# Patient Record
Sex: Female | Born: 1960 | Race: Black or African American | Hispanic: No | Marital: Single | Smoking: Current every day smoker
Health system: Southern US, Community
[De-identification: ages and names within clinical notes are randomized; demographics above are authoritative.]

## PROBLEM LIST (undated history)

## (undated) DIAGNOSIS — G5600 Carpal tunnel syndrome, unspecified upper limb: Secondary | ICD-10-CM

## (undated) DIAGNOSIS — M419 Scoliosis, unspecified: Secondary | ICD-10-CM

## (undated) DIAGNOSIS — G4730 Sleep apnea, unspecified: Secondary | ICD-10-CM

## (undated) DIAGNOSIS — M1990 Unspecified osteoarthritis, unspecified site: Secondary | ICD-10-CM

---

## 2010-12-07 ENCOUNTER — Emergency Department: Payer: Self-pay | Admitting: Emergency Medicine

## 2010-12-28 ENCOUNTER — Encounter: Payer: Self-pay | Admitting: Family Medicine

## 2011-01-10 ENCOUNTER — Encounter: Payer: Self-pay | Admitting: Family Medicine

## 2011-02-08 ENCOUNTER — Ambulatory Visit: Payer: Self-pay | Admitting: Family Medicine

## 2011-04-28 ENCOUNTER — Emergency Department: Payer: Self-pay | Admitting: Unknown Physician Specialty

## 2011-04-28 LAB — COMPREHENSIVE METABOLIC PANEL
Albumin: 4.3 g/dL (ref 3.4–5.0)
BUN: 16 mg/dL (ref 7–18)
Bilirubin,Total: 0.7 mg/dL (ref 0.2–1.0)
Co2: 28 mmol/L (ref 21–32)
Creatinine: 0.86 mg/dL (ref 0.60–1.30)
EGFR (African American): >60
Glucose: 112 mg/dL — ABNORMAL HIGH (ref 65–99)
Osmolality: 281 (ref 275–301)
Potassium: 3.4 mmol/L — ABNORMAL LOW (ref 3.5–5.1)
SGOT(AST): 20 U/L (ref 15–37)
Sodium: 140 mmol/L (ref 136–145)
Total Protein: 8.9 g/dL — ABNORMAL HIGH (ref 6.4–8.2)

## 2011-04-28 LAB — CBC
HGB: 12.9 g/dL (ref 12.0–16.0)
MCH: 29.3 pg (ref 26.0–34.0)
MCV: 89 fL (ref 80–100)
RBC: 4.42 10*6/uL (ref 3.80–5.20)
WBC: 9.5 10*3/uL (ref 3.6–11.0)

## 2011-04-28 LAB — MAGNESIUM: Magnesium: 2.2 mg/dL

## 2011-04-28 LAB — TROPONIN I: Troponin-I: <0.02 ng/mL

## 2011-07-05 ENCOUNTER — Encounter: Payer: Self-pay | Admitting: Family Medicine

## 2011-07-11 ENCOUNTER — Encounter: Payer: Self-pay | Admitting: Family Medicine

## 2011-10-26 ENCOUNTER — Emergency Department: Payer: Self-pay | Admitting: Emergency Medicine

## 2011-10-27 ENCOUNTER — Emergency Department: Payer: Self-pay | Admitting: Emergency Medicine

## 2011-10-27 LAB — COMPREHENSIVE METABOLIC PANEL
Alkaline Phosphatase: 200 U/L — ABNORMAL HIGH (ref 50–136)
BUN: 15 mg/dL (ref 7–18)
Bilirubin,Total: 1.1 mg/dL — ABNORMAL HIGH (ref 0.2–1.0)
Chloride: 103 mmol/L (ref 98–107)
Co2: 28 mmol/L (ref 21–32)
EGFR (African American): >60
EGFR (Non-African Amer.): >60
Glucose: 120 mg/dL — ABNORMAL HIGH (ref 65–99)
SGOT(AST): 31 U/L (ref 15–37)
SGPT (ALT): 39 U/L
Total Protein: 8.5 g/dL — ABNORMAL HIGH (ref 6.4–8.2)

## 2011-10-27 LAB — CBC
HCT: 33.7 % — ABNORMAL LOW (ref 35.0–47.0)
HGB: 11 g/dL — ABNORMAL LOW (ref 12.0–16.0)
MCH: 28.6 pg (ref 26.0–34.0)
MCV: 88 fL (ref 80–100)
Platelet: 355 10*3/uL (ref 150–440)
RBC: 3.84 10*6/uL (ref 3.80–5.20)
WBC: 15.9 10*3/uL — ABNORMAL HIGH (ref 3.6–11.0)

## 2011-10-27 LAB — URINALYSIS, COMPLETE
Protein: 100
RBC,UR: 17 /HPF (ref 0–5)
Specific Gravity: 1.034 (ref 1.003–1.030)
WBC UR: 16 /HPF (ref 0–5)

## 2011-10-27 LAB — PREGNANCY, URINE: Pregnancy Test, Urine: NEGATIVE m[IU]/mL

## 2012-10-03 ENCOUNTER — Encounter: Payer: Self-pay | Admitting: Family Medicine

## 2012-10-09 ENCOUNTER — Encounter: Payer: Self-pay | Admitting: Family Medicine

## 2013-08-21 IMAGING — CR DG ABDOMEN 2V
1 series · 2 of 2 positions shown · non-contrast
Comparison: none

REASON FOR EXAM: abdominal distension, no BM in 7 days
COMMENTS:

PROCEDURE:     DXR - DXR ABDOMEN 2 V FLAT AND ERECT  - October 26, 2011  [DATE]
RESULT:     Comparisons:  None

[Series 1: w abdomen upright · 0.14mm/px · 2 of 2 slices shown]
[im 1/2]
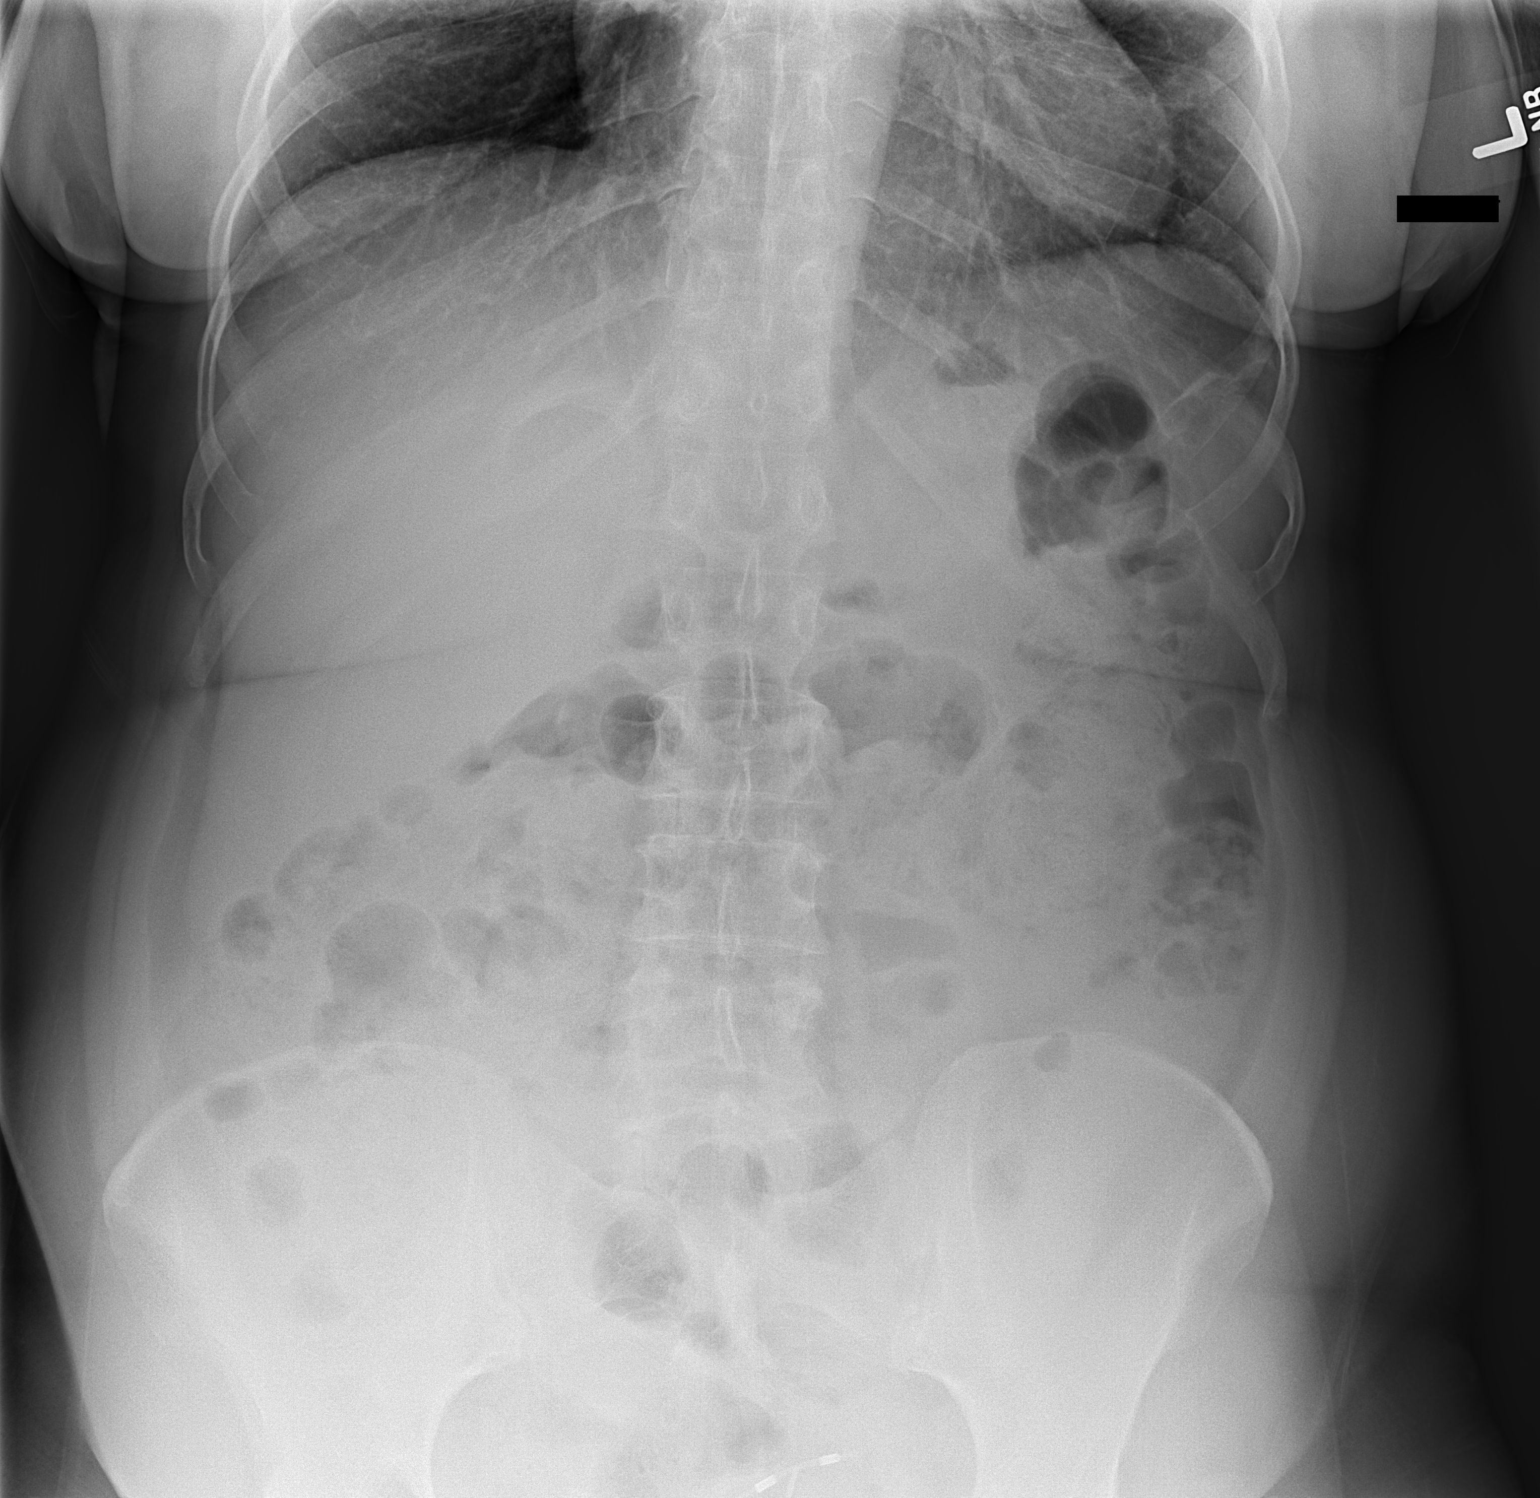
[im 2/2]
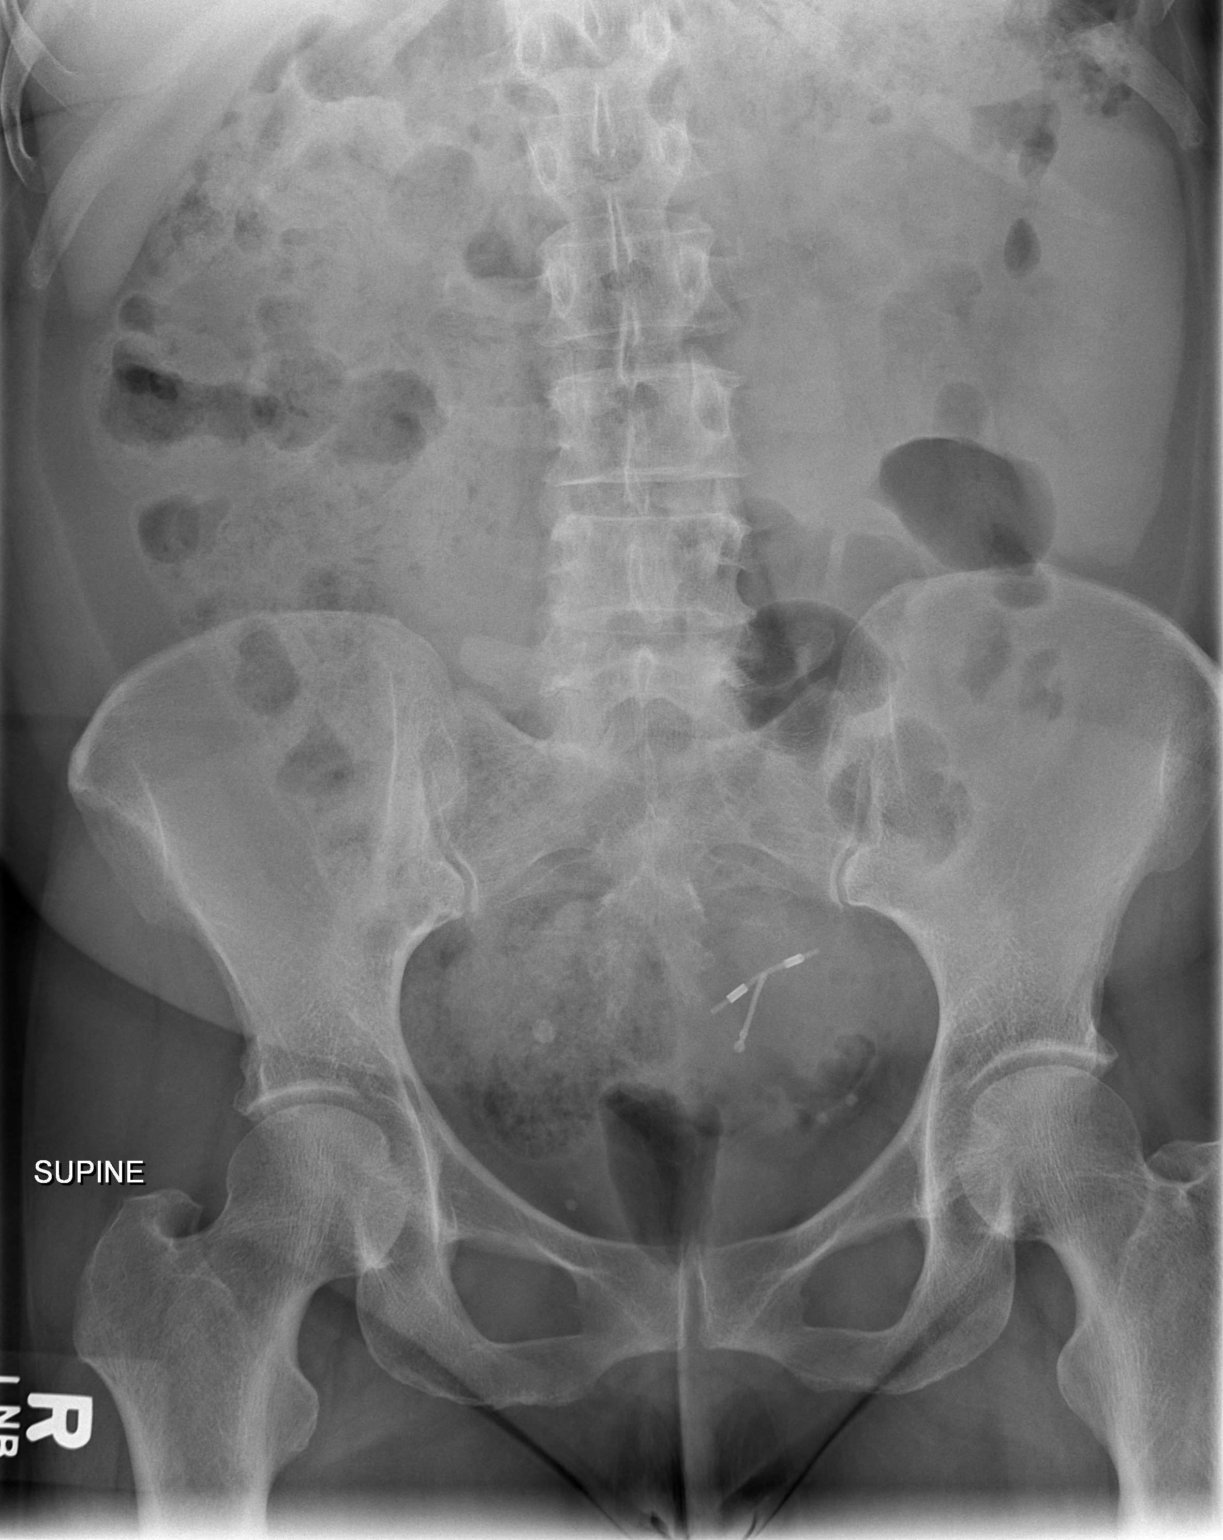

[2 of 2 positions shown; findings below may reference images not displayed]

FINDINGS: Supine and upright views of the abdomen are provided.

There is a nonspecific bowel gas pattern. There is no bowel dilatation to
suggest obstruction. There are no air-fluid levels. There is no pathologic
calcification along the expected course of the ureters. There is no evidence
of pneumoperitoneum, portal venous gas, or pneumatosis. There is a T type
IUD noted.

The osseous structures are unremarkable.
IMPRESSION: Unremarkable abdominal radiograph.

[REDACTED]

## 2014-07-24 ENCOUNTER — Emergency Department: Admit: 2014-07-24 | Disposition: A | Discharge status: Home / Self Care | Payer: Self-pay | Admitting: Internal Medicine

## 2015-11-05 ENCOUNTER — Other Ambulatory Visit: Payer: Self-pay | Admitting: Family Medicine

## 2015-11-05 DIAGNOSIS — Z1239 Encounter for other screening for malignant neoplasm of breast: Secondary | ICD-10-CM

## 2015-11-20 ENCOUNTER — Ambulatory Visit: Payer: Medicaid Other | Attending: Family Medicine

## 2016-05-19 IMAGING — CR DG LUMBAR SPINE 2-3V
1 series · 3 of 3 positions shown · non-contrast
Comparison: None

CLINICAL DATA: Fell with back pain

EXAM:
LUMBAR SPINE - 2-3 VIEW

[Series 1: dxr lumbar spine ap and lateral · 0.14mm/px · 3 of 3 slices shown]
[im 1/3]
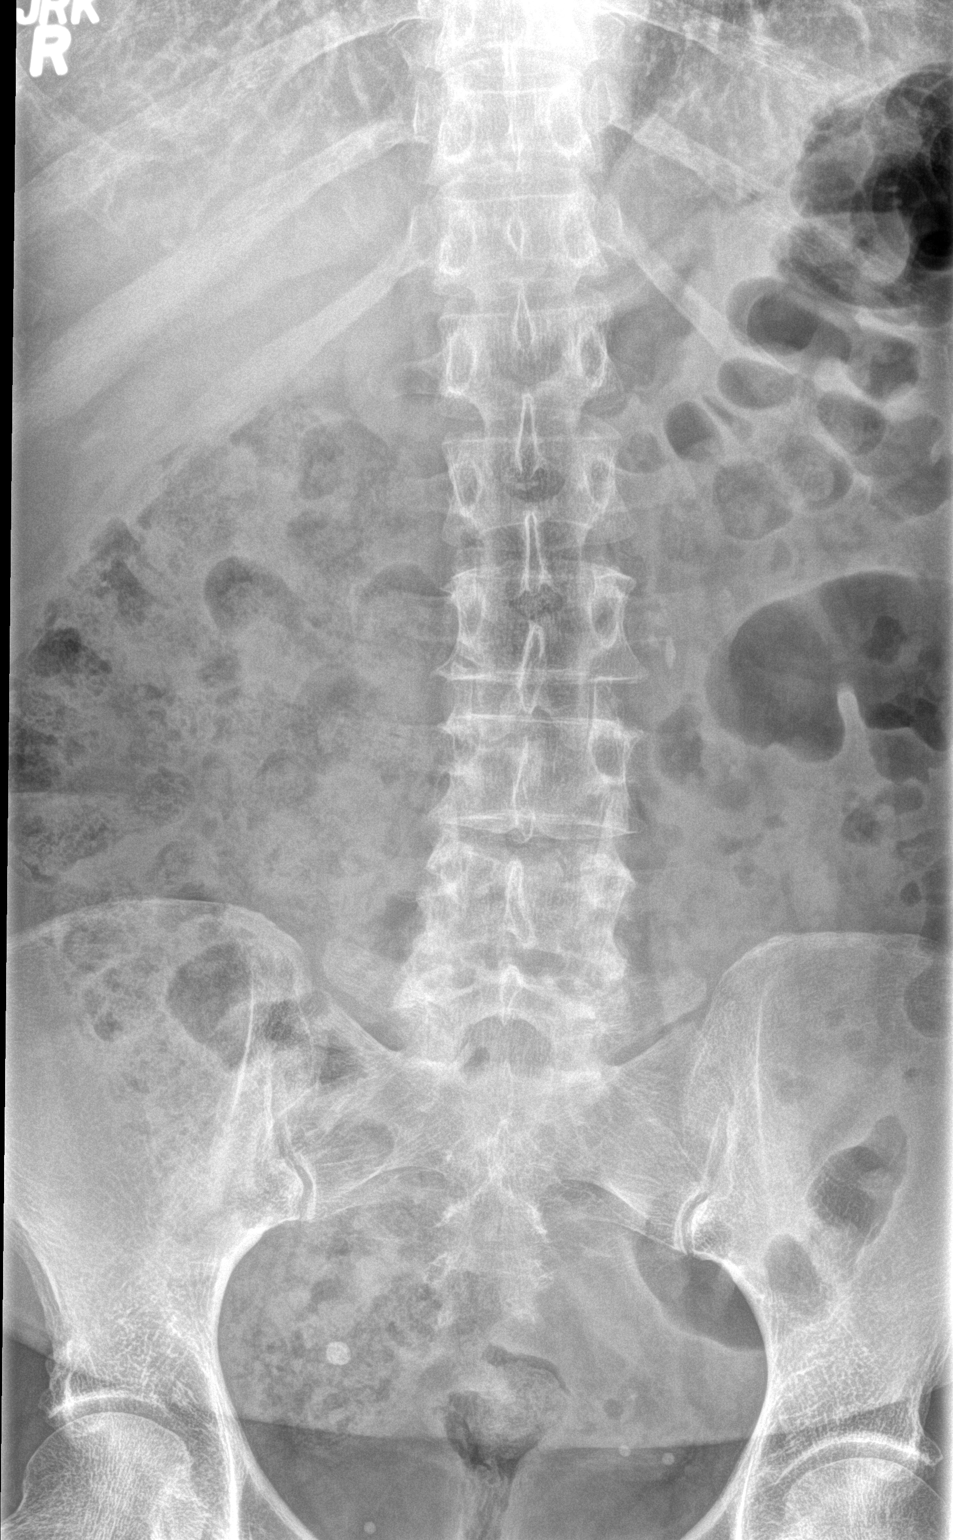
[im 2/3]
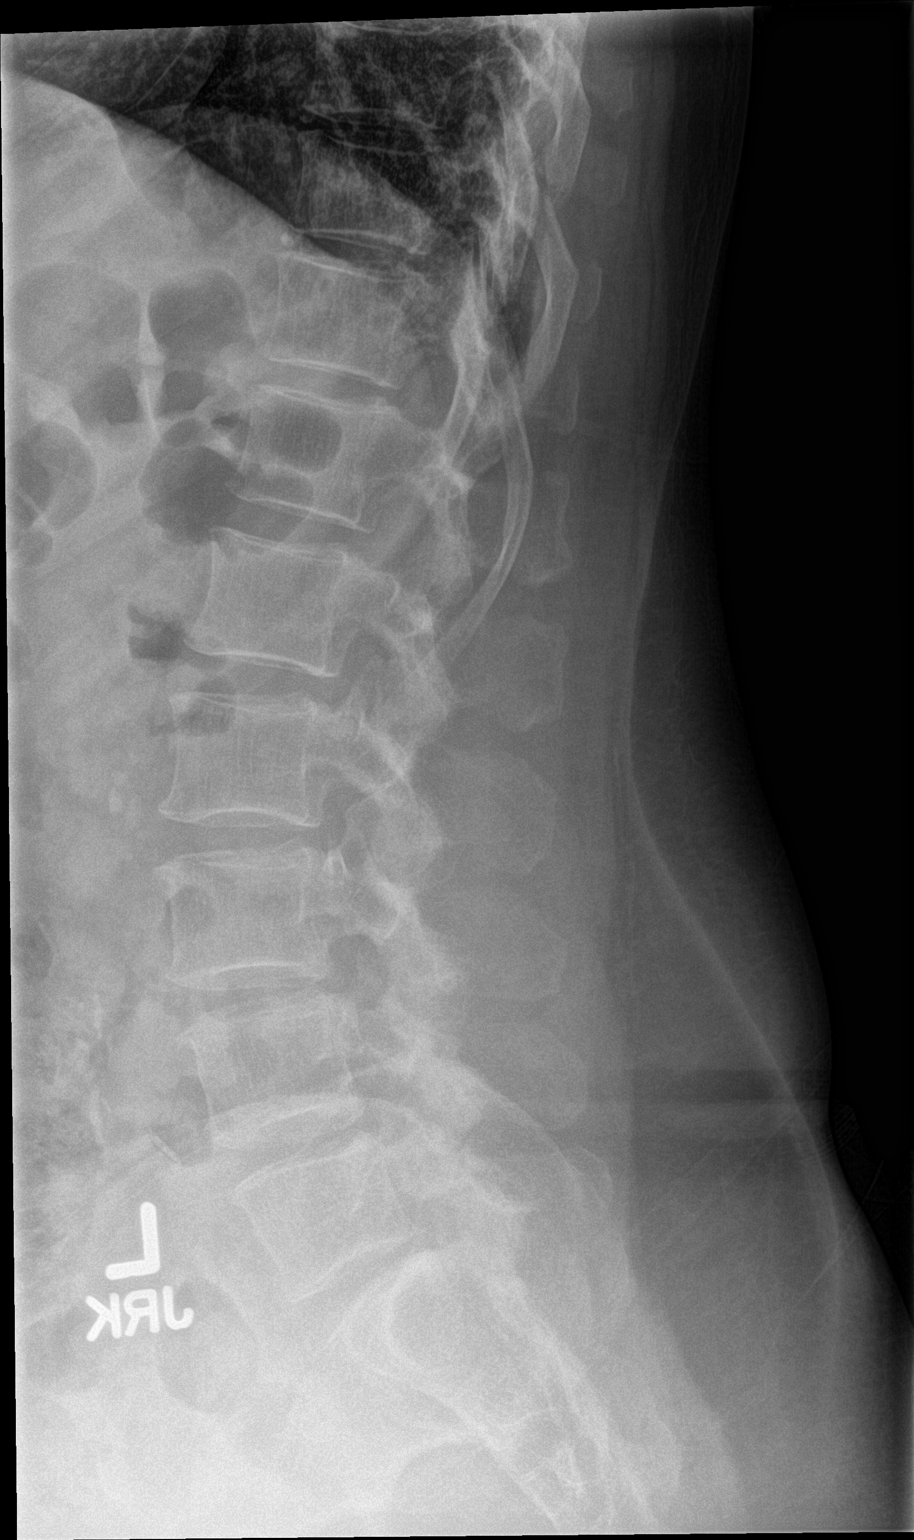
[im 3/3]
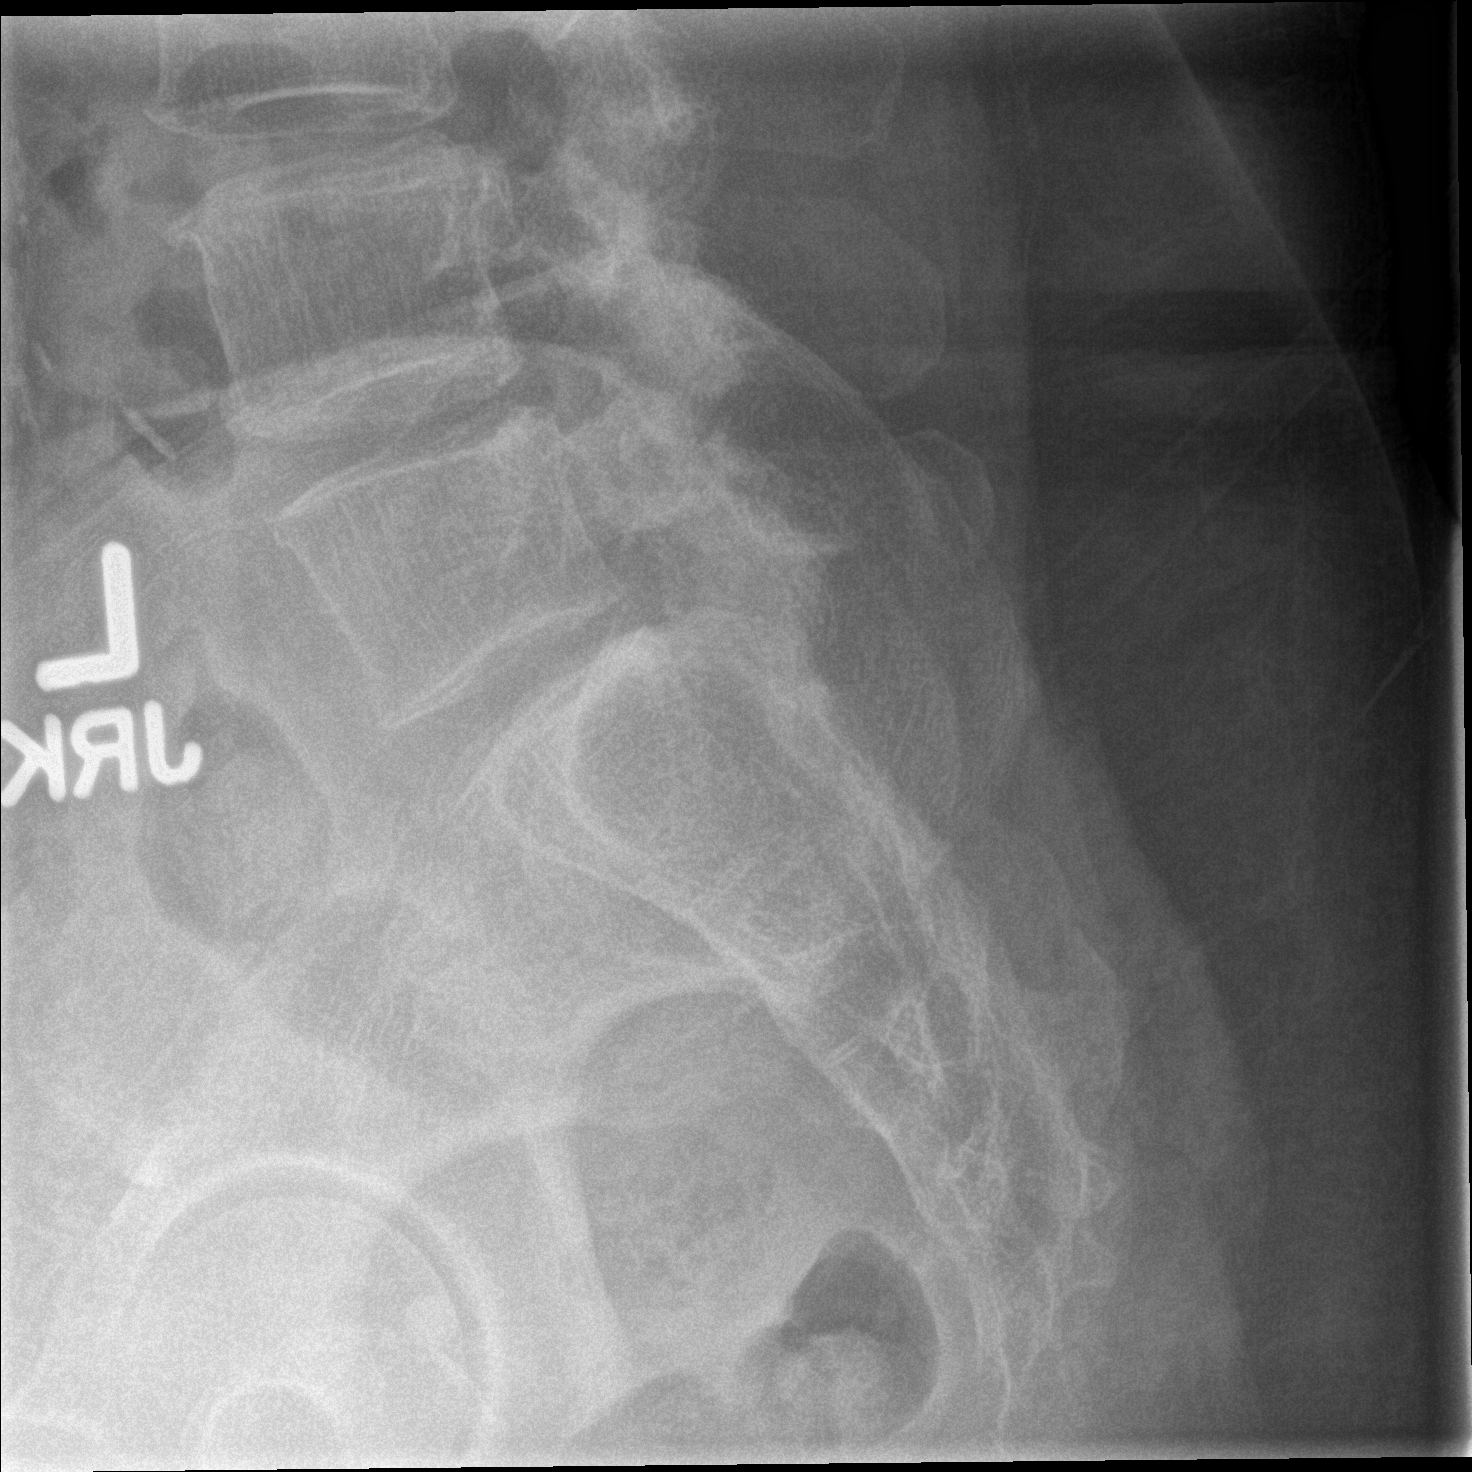

[3 of 3 positions shown; findings below may reference images not displayed]

FINDINGS: The lumbar vertebrae are in normal alignment. Intervertebral disc
spaces are relatively normal for age. No significant degenerative
disc disease is seen. No compression deformity is noted. The SI
joints are corticated. A moderate amount of feces is noted
throughout the colon.
IMPRESSION: Normal alignment with normal disc spaces.  No acute abnormality.

## 2016-07-14 ENCOUNTER — Other Ambulatory Visit: Payer: Self-pay | Admitting: Physician Assistant

## 2016-07-14 DIAGNOSIS — Z1239 Encounter for other screening for malignant neoplasm of breast: Secondary | ICD-10-CM

## 2017-12-20 ENCOUNTER — Other Ambulatory Visit: Payer: Self-pay | Admitting: Physician Assistant

## 2017-12-20 DIAGNOSIS — Z1231 Encounter for screening mammogram for malignant neoplasm of breast: Secondary | ICD-10-CM

## 2018-10-29 ENCOUNTER — Other Ambulatory Visit: Payer: Medicaid Other

## 2018-12-28 ENCOUNTER — Other Ambulatory Visit: Payer: Self-pay | Admitting: Physician Assistant

## 2018-12-28 DIAGNOSIS — Z1231 Encounter for screening mammogram for malignant neoplasm of breast: Secondary | ICD-10-CM

## 2019-04-02 ENCOUNTER — Ambulatory Visit
Admission: RE | Admit: 2019-04-02 | Discharge: 2019-04-02 | Disposition: A | Discharge status: Home / Self Care | Payer: Medicaid Other | Source: Ambulatory Visit | Attending: Physician Assistant | Admitting: Physician Assistant

## 2019-04-02 DIAGNOSIS — Z1231 Encounter for screening mammogram for malignant neoplasm of breast: Secondary | ICD-10-CM | POA: Diagnosis not present

## 2019-06-25 ENCOUNTER — Emergency Department: Payer: Medicaid Other

## 2019-06-25 ENCOUNTER — Emergency Department
Admission: EM | Admit: 2019-06-25 | Discharge: 2019-06-25 | Disposition: A | Discharge status: Home / Self Care | Payer: Medicaid Other | Attending: Emergency Medicine | Admitting: Emergency Medicine

## 2019-06-25 ENCOUNTER — Encounter: Payer: Self-pay | Admitting: Emergency Medicine

## 2019-06-25 ENCOUNTER — Other Ambulatory Visit: Payer: Self-pay

## 2019-06-25 DIAGNOSIS — Y9301 Activity, walking, marching and hiking: Secondary | ICD-10-CM | POA: Insufficient documentation

## 2019-06-25 DIAGNOSIS — S82121A Displaced fracture of lateral condyle of right tibia, initial encounter for closed fracture: Secondary | ICD-10-CM | POA: Insufficient documentation

## 2019-06-25 DIAGNOSIS — F17210 Nicotine dependence, cigarettes, uncomplicated: Secondary | ICD-10-CM | POA: Insufficient documentation

## 2019-06-25 DIAGNOSIS — X58XXXA Exposure to other specified factors, initial encounter: Secondary | ICD-10-CM | POA: Diagnosis not present

## 2019-06-25 DIAGNOSIS — Y999 Unspecified external cause status: Secondary | ICD-10-CM | POA: Insufficient documentation

## 2019-06-25 DIAGNOSIS — S8991XA Unspecified injury of right lower leg, initial encounter: Secondary | ICD-10-CM | POA: Diagnosis present

## 2019-06-25 DIAGNOSIS — Y929 Unspecified place or not applicable: Secondary | ICD-10-CM | POA: Insufficient documentation

## 2019-06-25 HISTORY — DX: Scoliosis, unspecified: M41.9

## 2019-06-25 HISTORY — DX: Unspecified osteoarthritis, unspecified site: M19.90

## 2019-06-25 HISTORY — DX: Sleep apnea, unspecified: G47.30

## 2019-06-25 HISTORY — DX: Carpal tunnel syndrome, unspecified upper limb: G56.00

## 2019-06-25 MED ORDER — IBUPROFEN 600 MG PO TABS
600.0000 mg | ORAL_TABLET | Freq: Once | ORAL | Status: AC
  Administered 2019-06-25: 600 mg via ORAL
  Filled 2019-06-25: qty 1

## 2019-06-25 MED ORDER — TRAMADOL HCL 50 MG PO TABS: 50.0000 mg | ORAL_TABLET | Freq: Four times a day (QID) | ORAL | 0 refills | Status: AC | PRN

## 2019-06-25 NOTE — ED Provider Notes (Signed)
[facility] Emergency Department Provider Note  ____________________________________________  Time seen: Approximately 4:22 AM  I have reviewed the triage vital signs and the nursing notes.   HISTORY  Chief Complaint Knee Pain   HPI Judy Hall is a 59 y.o. female who presents for evaluation of right knee pain.  Patient has been having pain in her knee for a couple of weeks.  Went to see an orthopedics doctor a week ago and received a steroid shot in her knee.  She reports some improvement of the pain however this evening as she was walking she felt that something popped in her knee.  She reports that the pain became severe, she was unable to walk.  The pain is mild at rest but becomes severe with weightbearing.  She does have crutches and has been using them at home.  She has not taken anything at home for the pain.  She denies any swelling of the leg, numbness, weakness, redness, fever or chills.  She was told by the orthopedics doctor that the x-rays done by them showed only arthritis of the knee.   Past Medical History:  Diagnosis Date   Arthritis    Carpal tunnel syndrome    Scoliosis    Sleep apnea     There are no problems to display for this patient.   History reviewed. No pertinent surgical history.  Prior to Admission medications   Not on File    Allergies Shellfish allergy  Family History  Problem Relation Age of Onset   Breast cancer Cousin     Social History Social History   Tobacco Use   Smoking status: Current Every Day Smoker    Packs/day: 0.50    Types: Cigarettes   Smokeless tobacco: Never Used  Substance Use Topics   Alcohol use: Never   Drug use: Never    Review of Systems  Constitutional: Negative for fever. Eyes: Negative for visual changes. ENT: Negative for sore throat. Neck: No neck pain  Cardiovascular: Negative for chest pain. Respiratory: Negative for shortness of  breath. Gastrointestinal: Negative for abdominal pain, vomiting or diarrhea. Genitourinary: Negative for dysuria. Musculoskeletal: Negative for back pain. + R knee pain Skin: Negative for rash. Neurological: Negative for headaches, weakness or numbness. Psych: No SI or HI  ____________________________________________   PHYSICAL EXAM:  VITAL SIGNS: ED Triage Vitals  Enc Vitals Group     BP 06/25/19 0306 (!) 151/71     Pulse Rate 06/25/19 0306 70     Resp 06/25/19 0306 18     Temp 06/25/19 0306 98.4 F (36.9 C)     Temp Source 06/25/19 0306 Oral     SpO2 06/25/19 0306 99 %     Weight 06/25/19 0307 174 lb (78.9 kg)     Height 06/25/19 0307 5' 5.5" (1.664 m)     Head Circumference --      Peak Flow --      Pain Score 06/25/19 0307 10     Pain Loc --      Pain Edu? --      Excl. in [place]? --     Constitutional: Alert and oriented. Well appearing and in no apparent distress. HEENT:      Head: Normocephalic and atraumatic.         Eyes: Conjunctivae are normal. Sclera is non-icteric.       Mouth/Throat: Mucous membranes are moist.       Neck: Supple with no signs of  meningismus. Cardiovascular: Regular rate and rhythm.  Respiratory: Normal respiratory effort.  Musculoskeletal: There is no swelling or erythema of the right knee, full range of motion of the joint, mild tenderness to palpation of the anterior tibia, no obvious deformity, no swelling of the right lower extremity, intact distal pulses, normal strength and sensation. Neurologic: Normal speech and language. Face is symmetric. Moving all extremities. No gross focal neurologic deficits are appreciated. Skin: Skin is warm, dry and intact. No rash noted. Psychiatric: Mood and affect are normal. Speech and behavior are normal.  ____________________________________________   LABS (all labs ordered are listed, but only abnormal results are displayed)  Labs Reviewed - No data to  display ____________________________________________  EKG  none  ____________________________________________  RADIOLOGY  I have personally reviewed the images performed during this visit and I agree with the Radiologist's read.   Interpretation by Radiologist:  DG Knee Complete 4 Views Right  Result Date: 06/25/2019 CLINICAL DATA:  Knee pain, felt pop EXAM: RIGHT KNEE - COMPLETE 4+ VIEW COMPARISON:  None. FINDINGS: Small geometric radiodensity seen along the anterolateral aspect of the tibia, without clear donor site identified however this could reflect a small avulsion in the appropriate clinical setting. Trace effusion is present. Enthesopathic changes at the patellar insertion of the distal quadriceps tendon. No other acute or suspicious osseous abnormality. IMPRESSION: Small geometric radiodensity seen along the anterolateral aspect of the tibia, without clear donor site identified. Could reflect a small avulsion in the given clinical setting though overall is indeterminate. Trace effusion. Electronically Signed   By: Kreg Shropshire M.D.   On: 06/25/2019 03:38     ____________________________________________   PROCEDURES  Procedure(s) performed: None Procedures Critical Care performed:  None ____________________________________________   INITIAL IMPRESSION / ASSESSMENT AND PLAN / ED COURSE  59 y.o. female who presents for evaluation of acute on chronic right knee pain.  Unfortunately I am unable to see the recent visit to the orthopedic surgeon or previous x-ray done of the knee since they are not present in patient's MAR.  On exam there is no signs of septic joint with full range of motion of the knee, no swelling or erythema, no signs of sepsis.  Patient has no neurological deficits, strong distal pulses and brisk capillary refill with normal sensation and strength and no signs of dissection.  Pain is reproducible with palpation of the anterior tibia and x-ray shows a small  avulsion which could be a small avulsion from the tibia.  Will place patient on a knee immobilizer.  Recommended ibuprofen 600 mg 3 times daily for the next 3 days and tramadol as needed for breakthrough pain.  Recommended close follow-up with her orthopedic surgeon.  Weightbearing as tolerated.  Patient has crutches at home.      _____________________________________________ Please note:  Patient was evaluated in Emergency Department today for the symptoms described in the history of present illness. Patient was evaluated in the context of the global COVID-19 pandemic, which necessitated consideration that the patient might be at risk for infection with the SARS-CoV-2 virus that causes COVID-19. Institutional protocols and algorithms that pertain to the evaluation of patients at risk for COVID-19 are in a state of rapid change based on information released by regulatory bodies including the CDC and federal and state organizations. These policies and algorithms were followed during the patient's care in the ED.  Some ED evaluations and interventions may be delayed as a result of limited staffing during the pandemic.   ____________________________________________  FINAL CLINICAL IMPRESSION(S) / ED DIAGNOSES   Final diagnoses:  Closed avulsion fracture of lateral condyle of right tibia, initial encounter      NEW MEDICATIONS STARTED DURING THIS VISIT:  ED Discharge Orders    None       Note:  This document was prepared using Dragon voice recognition software and may include unintentional dictation errors.    Don Perking, [place], MD 06/25/19 [phone]

## 2019-06-25 NOTE — ED Triage Notes (Signed)
Pt arrived to ED with continued knee pain. Pt states she was seen by MD last Monday for the same. Pt was told she has arthritis and was given a shot in her knee for her pain. Tonight pt states she felt something pop while she was waking around and she hasn't been able to walk on it since then.

## 2019-06-25 NOTE — Discharge Instructions (Signed)
Your XR shows a small avulsion of the bone on your leg. Use the knee immobilizer as needed for pain control, use the crutches. You can touch your leg to the ground and bear weight as tolerated. Take ibuprofen 600mg  3 times a day for the next 3 days. Take 50mg  of tramadol every 6 hours as needed for breakthrough pain. Follow up with your doctor who gave you a shot on the knee in 3 days. Return to the ER for swelling of the leg, redness, warmth, worsening pain, numbness or weakness of the leg, or any other symptoms concerning to you.

## 2020-03-18 ENCOUNTER — Other Ambulatory Visit: Payer: Self-pay | Admitting: Physician Assistant

## 2020-03-18 DIAGNOSIS — Z1231 Encounter for screening mammogram for malignant neoplasm of breast: Secondary | ICD-10-CM

## 2020-06-16 ENCOUNTER — Ambulatory Visit: Payer: Medicaid Other | Admitting: Dermatology

## 2020-06-16 ENCOUNTER — Other Ambulatory Visit: Payer: Self-pay

## 2020-06-16 DIAGNOSIS — L309 Dermatitis, unspecified: Secondary | ICD-10-CM | POA: Diagnosis not present

## 2020-06-16 DIAGNOSIS — L732 Hidradenitis suppurativa: Secondary | ICD-10-CM

## 2020-06-16 DIAGNOSIS — L905 Scar conditions and fibrosis of skin: Secondary | ICD-10-CM | POA: Diagnosis not present

## 2020-06-16 MED ORDER — DOXYCYCLINE HYCLATE 100 MG PO TABS: 100.0000 mg | ORAL_TABLET | Freq: Two times a day (BID) | ORAL | 3 refills | Status: DC

## 2020-06-16 MED ORDER — OPZELURA 1.5 % EX CREA: 1.0000 "application " | TOPICAL_CREAM | Freq: Every day | CUTANEOUS | 3 refills | Status: AC

## 2020-06-16 NOTE — Patient Instructions (Signed)
Restart Doxycycline 100 mg 1 tablet twice daily x 1 week then decrease to 1 tablet daily until clear with food and plenty of fluid. Restart with any flares

## 2020-06-16 NOTE — Progress Notes (Unsigned)
   Follow-Up Visit   Subjective  Judy Hall is a 60 y.o. female who presents for the following: Eczema (Hands - has used 2.5% HC cream in the past. Skin is very dry). She also has history of severe hidradenitis with boils in the axillary area in the past.  She did well for years but has recently had a flare of this condition.  She wonders about treatment options. She also has scars on her face from past acne and wonders about treatment options for the scars.  The following portions of the chart were reviewed this encounter and updated as appropriate:   Tobacco  Allergies  Meds  Problems  Med Hx  Surg Hx  Fam Hx     Review of Systems:  No other skin or systemic complaints except as noted in HPI or Assessment and Plan.  Objective  Well appearing patient in no apparent distress; mood and affect are within normal limits.  A focused examination was performed including hands, feet, face. Relevant physical exam findings are noted in the Assessment and Plan.  Objective  Hands, feet: Fine scale of feet. Xerosis of hands. Lichenification of fingers.  Objective  Face: Scars   Assessment & Plan  Eczema Hands, feet Atopic dermatitis (eczema) is a chronic, relapsing, pruritic condition that can significantly affect quality of life. It is often associated with allergic rhinitis and/or asthma and can require treatment with topical medications, phototherapy, or in severe cases a biologic medication called Dupixent in older children and adults.   Ruxolitinib Phosphate (OPZELURA) 1.5 % CREA - Hands, feet  Facial pitted Scars from Acne in past Face Discussed HALO laser. Advised patient not covered by insurance. Info given.  Hidradenitis suppurativa Axillae With recent flare. Restart Doxycycline 100 mg 1 po bid x 1 week for each flare; then decrease to qd for 1-3 weeks until things calm down. Take with food and plenty of fluid May consider isotretinoin or Humira in the future if  persistent problems.  Hidradenitis Suppurativa is a chronic; persistent; non-curable, but treatable condition due to abnormal inflamed sweat glands in the body folds (axilla, inframammary, groin, medial thighs), causing recurrent painful cysts and scarring. It can be associated with severe scarring acne and cysts; abscesses and scarring of scalp. The goal is control and prevention of flares, as it is not curable. Scars are permanent and can be thickened. Treatment may include daily use of topical medication and oral antibiotics.  Oral isotretinoin may also be helpful.  For more severe cases, Humira (a biologic injection) may be prescribed to decrease the inflammatory process and prevent flares.  When indicated, inflamed cysts may also be treated surgically.  doxycycline (VIBRA-TABS) 100 MG tablet - Right Axilla  Return in about 6 weeks (around 07/28/2020).  I, Joanie Coddington, CMA, am acting as scribe for Armida Sans, MD .  Documentation: I have reviewed the above documentation for accuracy and completeness, and I agree with the above.  Armida Sans, MD

## 2020-06-17 ENCOUNTER — Encounter: Payer: Self-pay | Admitting: Dermatology

## 2020-06-17 ENCOUNTER — Telehealth: Payer: Self-pay

## 2020-06-17 DIAGNOSIS — L309 Dermatitis, unspecified: Secondary | ICD-10-CM

## 2020-06-17 MED ORDER — EUCRISA 2 % EX OINT: TOPICAL_OINTMENT | CUTANEOUS | 3 refills | Status: AC

## 2020-06-17 NOTE — Telephone Encounter (Signed)
Eucrisa sent to pharmacy.  

## 2020-06-17 NOTE — Addendum Note (Signed)
Addended by: Epifania Gore on: 06/17/2020 05:24 PM   Modules accepted: Orders

## 2020-06-17 NOTE — Telephone Encounter (Signed)
Opzelura not covered. Pt has medicaid. Please advise on a replacement.

## 2020-06-17 NOTE — Telephone Encounter (Signed)
Try sending [place]. If Eucrisa not covered, try Protopic ointment. If Protopic oint not covered, try Elidel cream.

## 2020-06-29 ENCOUNTER — Other Ambulatory Visit: Payer: Self-pay

## 2020-06-29 MED ORDER — DOXYCYCLINE MONOHYDRATE 100 MG PO CAPS: 100.0000 mg | ORAL_CAPSULE | Freq: Two times a day (BID) | ORAL | 3 refills | Status: AC

## 2020-07-29 ENCOUNTER — Ambulatory Visit: Payer: Medicaid Other | Admitting: Dermatology

## 2020-10-21 ENCOUNTER — Other Ambulatory Visit: Payer: Self-pay | Admitting: Physician Assistant

## 2020-10-21 DIAGNOSIS — Z1231 Encounter for screening mammogram for malignant neoplasm of breast: Secondary | ICD-10-CM

## 2020-11-25 ENCOUNTER — Ambulatory Visit: Payer: Medicaid Other | Admitting: Dermatology

## 2021-01-26 IMAGING — MG DIGITAL SCREENING BILAT W/ TOMO W/ CAD
6 of 10 series · 6 of 30 positions shown · non-contrast
Comparison: None.

CLINICAL DATA: Screening.

EXAM:
DIGITAL SCREENING BILATERAL MAMMOGRAM WITH TOMO AND CAD

[R CC synth-2D]
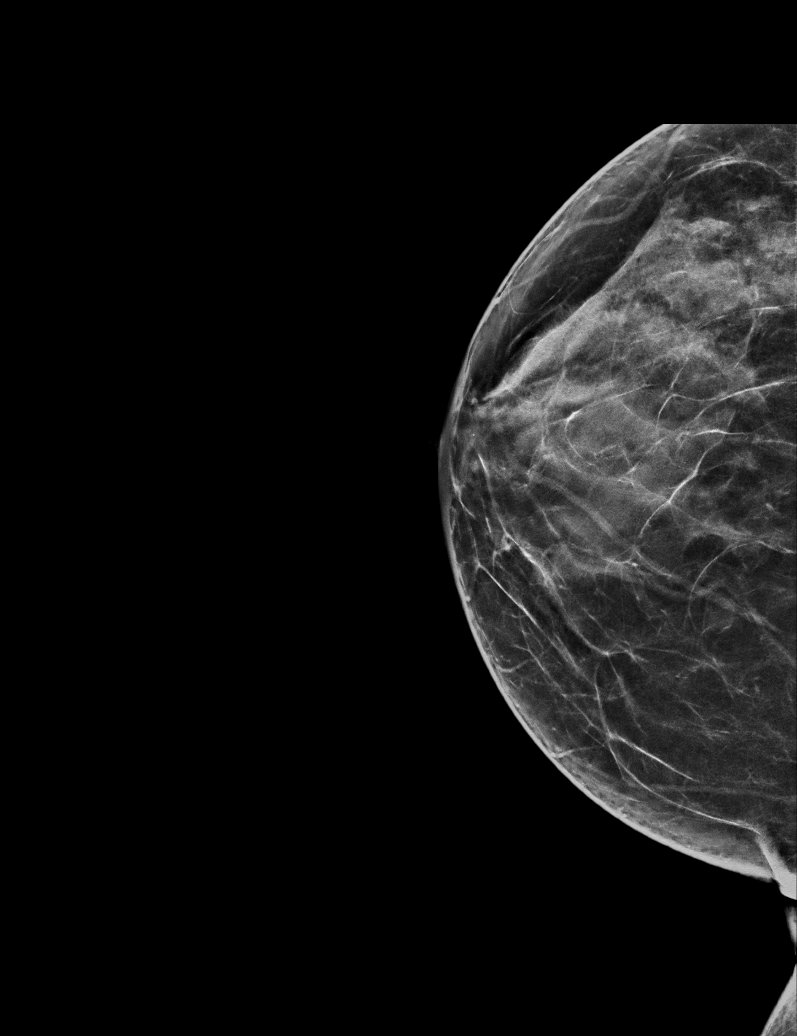

[L MLO synth-2D (1 of 2)]
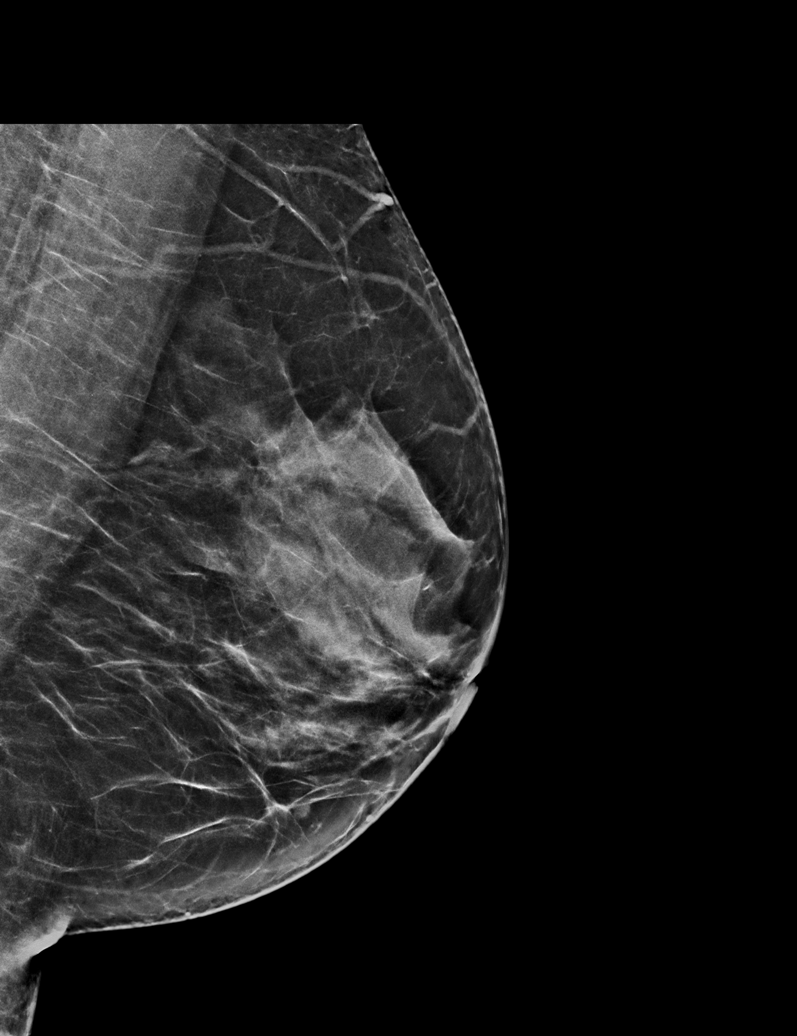

[L CC synth-2D]
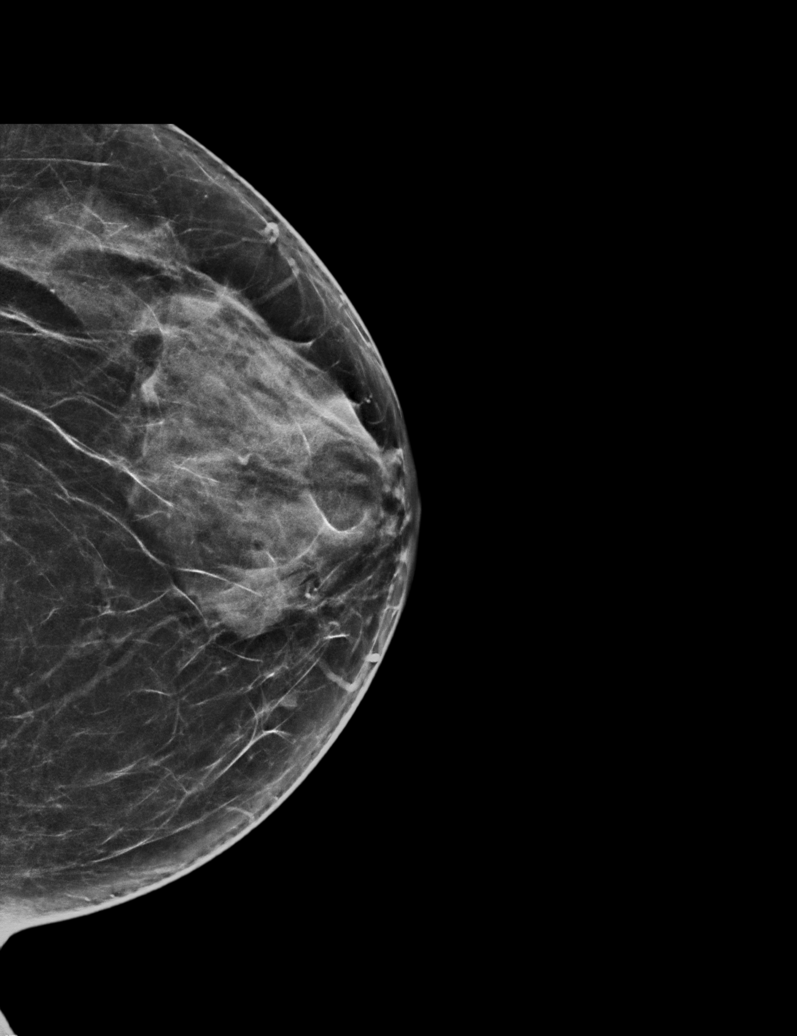

[L MLO synth-2D (2 of 2)]
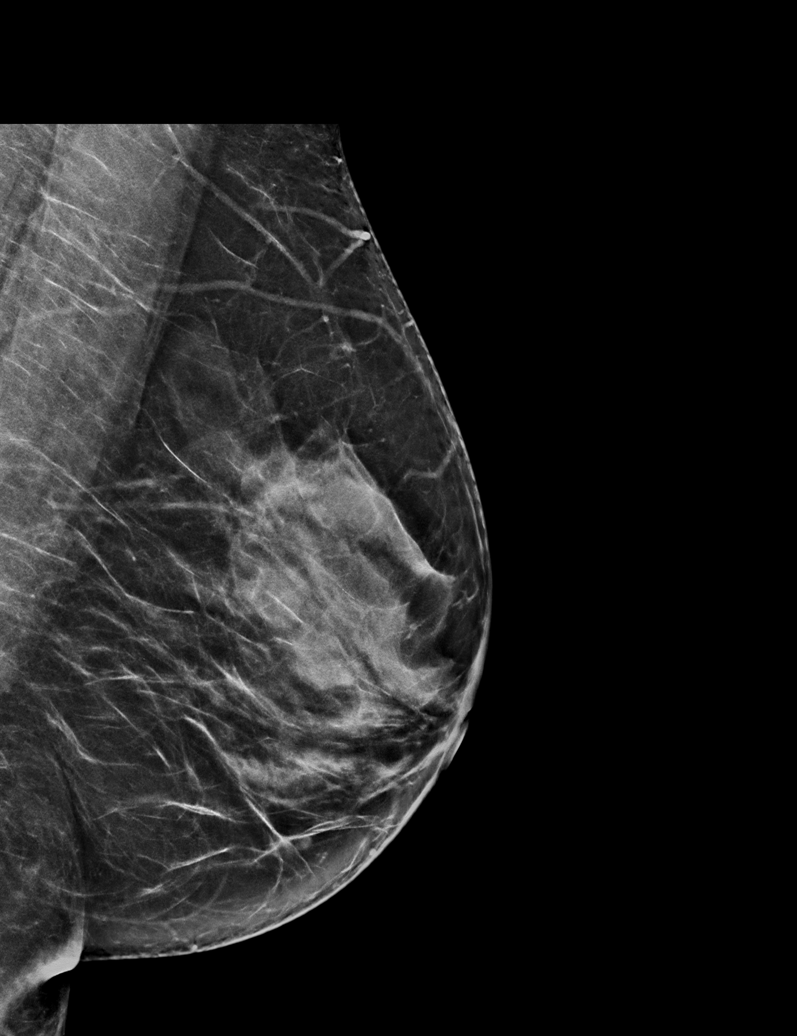

[R MLO synth-2D]
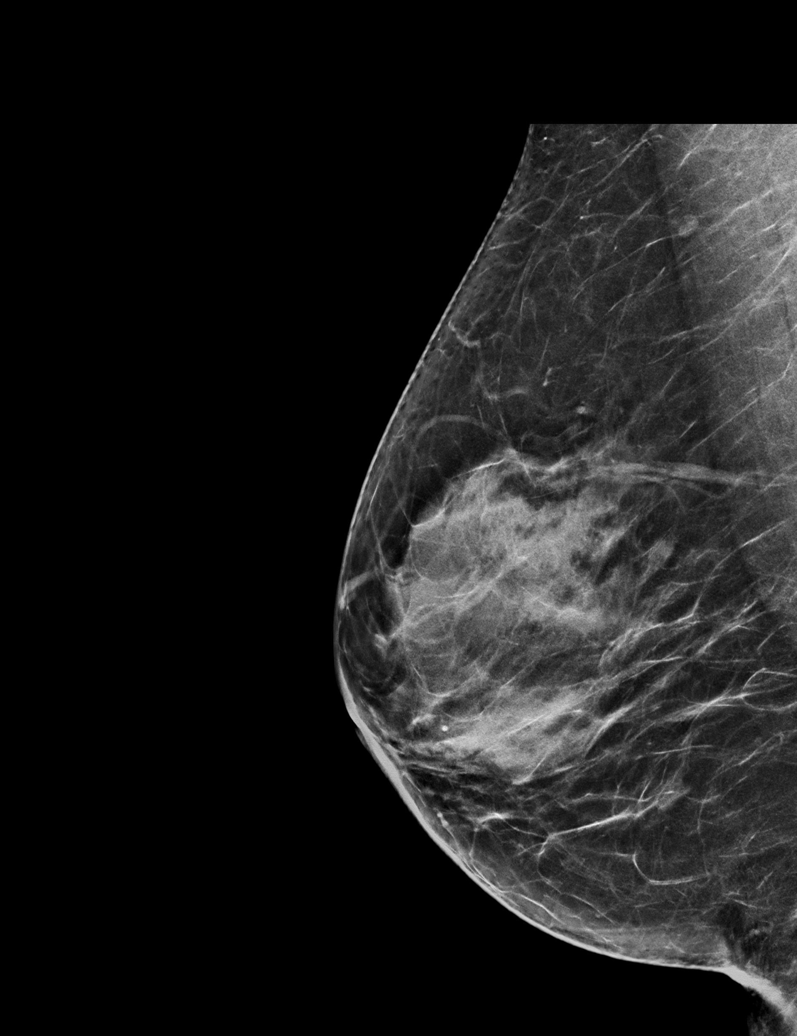

[L MLO tomo · tomo slice 35/68.0]
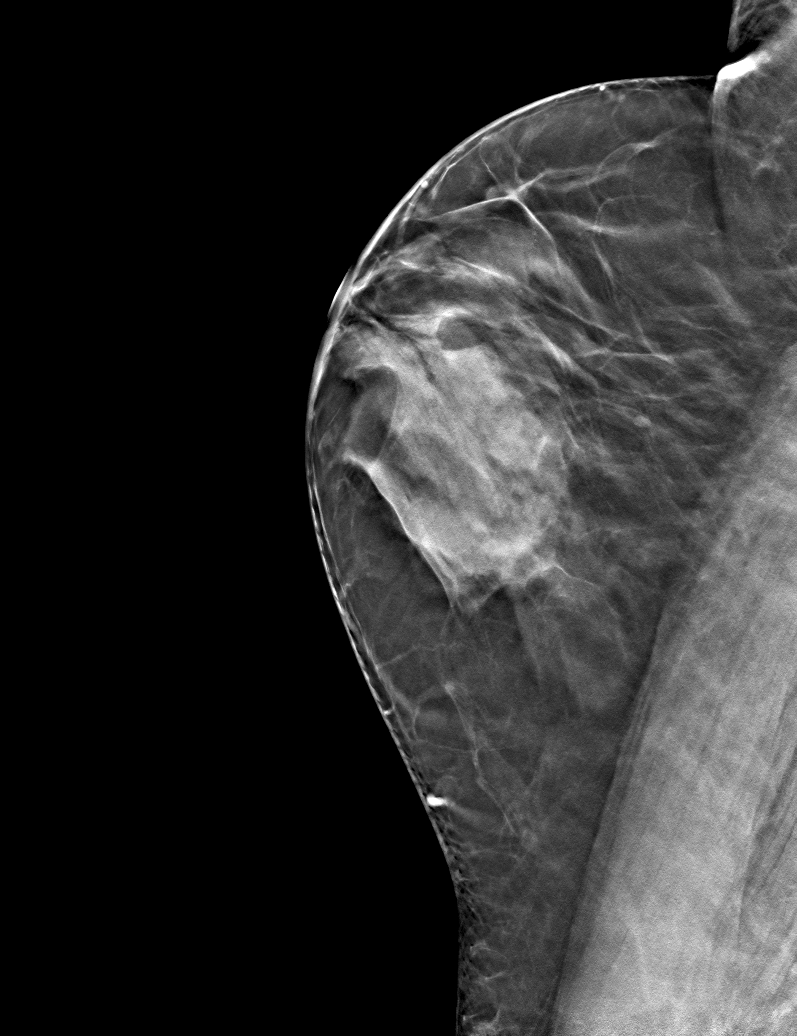

[6 of 30 positions shown; findings below may reference images not displayed]

ACR Breast Density Category c: The breast tissue is heterogeneously
dense, which may obscure small masses
FINDINGS: There are no findings suspicious for malignancy. Images were
processed with CAD.
IMPRESSION: No mammographic evidence of malignancy. A result letter of this
screening mammogram will be mailed directly to the patient.

RECOMMENDATION:
Screening mammogram in one year. (Code:EM-2-IHY)

BI-RADS CATEGORY  1: Negative.

## 2021-07-19 ENCOUNTER — Other Ambulatory Visit: Payer: Self-pay

## 2021-07-19 ENCOUNTER — Emergency Department
Admission: EM | Admit: 2021-07-19 | Discharge: 2021-07-19 | Disposition: A | Discharge status: Home / Self Care | Payer: Medicaid Other | Attending: Emergency Medicine | Admitting: Emergency Medicine

## 2021-07-19 ENCOUNTER — Encounter: Payer: Self-pay | Admitting: Intensive Care

## 2021-07-19 DIAGNOSIS — M542 Cervicalgia: Secondary | ICD-10-CM | POA: Diagnosis present

## 2021-07-19 DIAGNOSIS — M25512 Pain in left shoulder: Secondary | ICD-10-CM | POA: Diagnosis not present

## 2021-07-19 DIAGNOSIS — M79652 Pain in left thigh: Secondary | ICD-10-CM | POA: Insufficient documentation

## 2021-07-19 DIAGNOSIS — Y92410 Unspecified street and highway as the place of occurrence of the external cause: Secondary | ICD-10-CM | POA: Diagnosis not present

## 2021-07-19 MED ORDER — CYCLOBENZAPRINE HCL 10 MG PO TABS: 10.0000 mg | ORAL_TABLET | Freq: Three times a day (TID) | ORAL | 0 refills | Status: AC | PRN

## 2021-07-19 MED ORDER — IBUPROFEN 800 MG PO TABS: 800.0000 mg | ORAL_TABLET | Freq: Three times a day (TID) | ORAL | 0 refills | Status: AC | PRN
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
## 2021-07-19 NOTE — Discharge Instructions (Addendum)
-  Treat pain with Tylenol/ibuprofen as needed.  You may additionally take cyclobenzaprine as muscle relaxer. ?-Please review the educational material provided ?-Return to the emergency department anytime if you begin to experience any new or worsening symptoms. ?
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
## 2021-11-16 ENCOUNTER — Other Ambulatory Visit: Payer: Self-pay | Admitting: Physician Assistant

## 2021-11-16 DIAGNOSIS — Z1231 Encounter for screening mammogram for malignant neoplasm of breast: Secondary | ICD-10-CM

## 2023-01-19 ENCOUNTER — Other Ambulatory Visit: Payer: Self-pay | Admitting: Physician Assistant

## 2023-01-19 DIAGNOSIS — Z1231 Encounter for screening mammogram for malignant neoplasm of breast: Secondary | ICD-10-CM

## 2023-02-22 ENCOUNTER — Ambulatory Visit
Admission: RE | Admit: 2023-02-22 | Discharge: 2023-02-22 | Disposition: A | Discharge status: Home / Self Care | Payer: Medicaid Other | Source: Ambulatory Visit | Attending: Physician Assistant | Admitting: Physician Assistant

## 2023-02-22 DIAGNOSIS — Z1231 Encounter for screening mammogram for malignant neoplasm of breast: Secondary | ICD-10-CM | POA: Insufficient documentation

## 2023-04-11 ENCOUNTER — Emergency Department
Admission: EM | Admit: 2023-04-11 | Discharge: 2023-04-11 | Discharge status: Against Medical Advice | Payer: No Typology Code available for payment source | Source: Home / Self Care

## 2023-05-16 ENCOUNTER — Ambulatory Visit: Payer: Medicaid Other | Admitting: Podiatry

## 2024-02-13 ENCOUNTER — Other Ambulatory Visit: Payer: Self-pay | Admitting: Physician Assistant

## 2024-02-13 DIAGNOSIS — Z1231 Encounter for screening mammogram for malignant neoplasm of breast: Secondary | ICD-10-CM

## 2024-03-21 ENCOUNTER — Ambulatory Visit
Admission: RE | Admit: 2024-03-21 | Discharge: 2024-03-21 | Disposition: A | Discharge status: Home / Self Care | Source: Ambulatory Visit | Attending: Physician Assistant | Admitting: Physician Assistant

## 2024-03-21 DIAGNOSIS — Z1231 Encounter for screening mammogram for malignant neoplasm of breast: Secondary | ICD-10-CM | POA: Diagnosis present

## 2024-04-18 ENCOUNTER — Ambulatory Visit
